# Patient Record
Sex: Female | Born: 1965 | Hispanic: Yes | Marital: Married | Smoking: Never smoker
Health system: Southern US, Community
[De-identification: ages and names within clinical notes are randomized; demographics above are authoritative.]

## PROBLEM LIST (undated history)

## (undated) DIAGNOSIS — E669 Obesity, unspecified: Secondary | ICD-10-CM

## (undated) DIAGNOSIS — I10 Essential (primary) hypertension: Secondary | ICD-10-CM

## (undated) HISTORY — PX: TUBAL LIGATION: SHX77

## (undated) HISTORY — PX: CHOLECYSTECTOMY: SHX55

## (undated) HISTORY — PX: OTHER SURGICAL HISTORY: SHX169

---

## 2007-02-07 ENCOUNTER — Ambulatory Visit: Payer: Self-pay | Admitting: Family Medicine

## 2010-08-24 ENCOUNTER — Ambulatory Visit: Payer: Self-pay | Admitting: Family Medicine

## 2010-08-30 ENCOUNTER — Ambulatory Visit: Payer: Self-pay | Admitting: Family Medicine

## 2011-05-09 ENCOUNTER — Emergency Department: Payer: Self-pay | Admitting: *Deleted

## 2011-09-28 IMAGING — US ULTRASOUND RIGHT BREAST
1 series · 7 of 7 positions shown · non-contrast
Comparison: none

REASON FOR EXAM: RT MASS
COMMENTS:

PROCEDURE:     US  - US BREAST RIGHT  - August 30, 2010  [DATE]
RESULT:

[Series 1: ultrasound right breast · 7 of 7 slices shown]
[im 1/7]
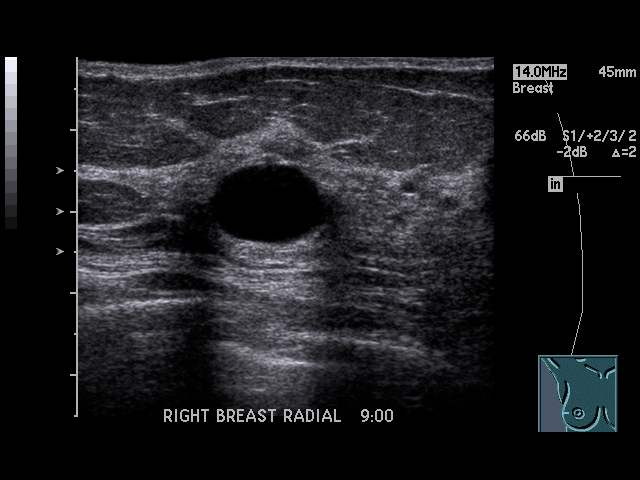
[im 2/7]
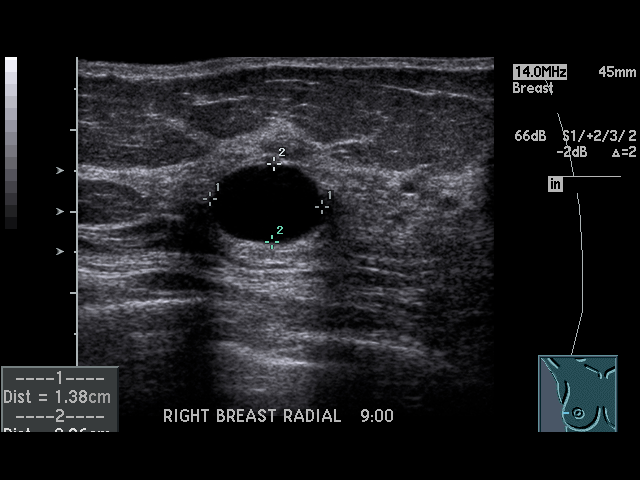
[im 3/7]
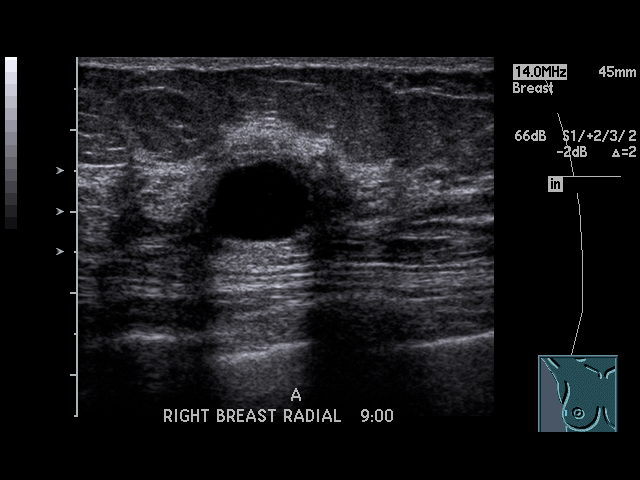
[im 4/7]
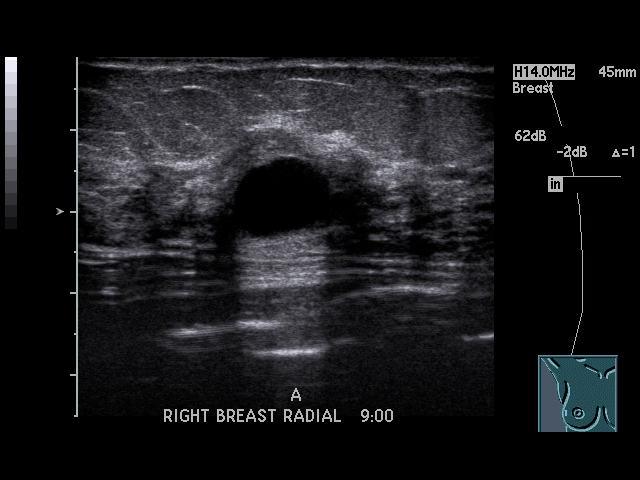
[im 5/7]
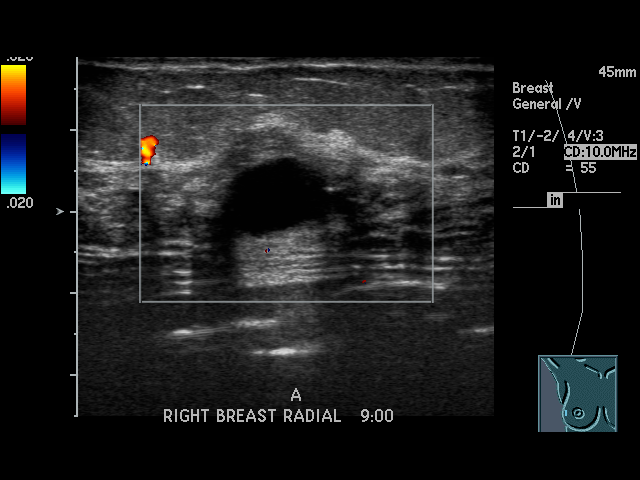
[im 6/7]
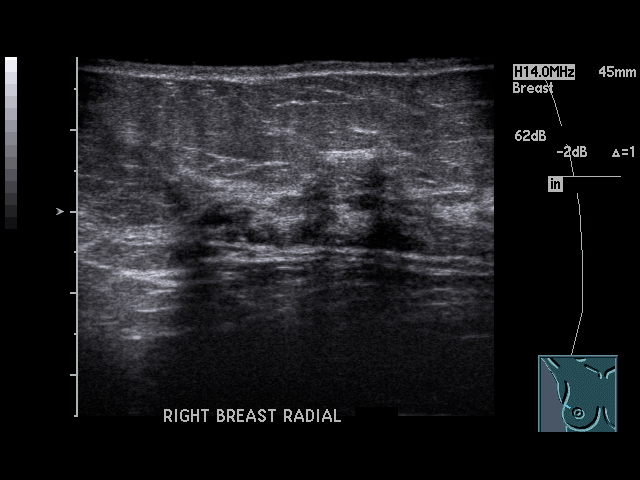
[im 7/7]
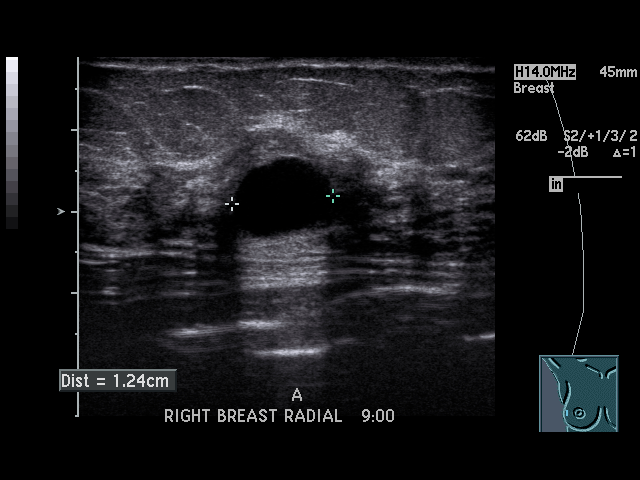

[7 of 7 positions shown; findings below may reference images not displayed]

FINDINGS: Real-time sonography of the right breast was performed in the 9
o'clock position. There is a well-circumscribed anechoic right breast mass
measuring 1.4 x 1 x 1.2 cm at the 9 o'clock position with increased through
transmission and an imperceptible wall. There is no mural nodule. There is
no internal septation. The appearance is most consistent with a cyst.
IMPRESSION: 1.      Previously demonstrated mammographic abnormality at the 9 o'clock
position in the right breast demonstrates sonographic characteristics most
consistent with a small cyst. No further evaluation recommended.
2.     Return to annual mammographic follow-up is recommended.

BI-RADS: Category 2 - Benign Findings

Thank you for this opportunity to contribute to the care of your patient.

A NEGATIVE MAMMOGRAM REPORT DOES NOT PRECLUDE BIOPSY OR OTHER EVALUATION OF
A CLINICALLY PALPABLE OR OTHERWISE SUSPICIOUS MASS OR LESION. BREAST CANCER
MAY NOT BE DETECTED BY MAMMOGRAPHY IN UP TO 10% OF CASES.

## 2012-08-28 ENCOUNTER — Ambulatory Visit: Payer: Self-pay | Admitting: Family Medicine

## 2013-09-24 ENCOUNTER — Ambulatory Visit: Payer: Self-pay | Admitting: Family Medicine

## 2014-11-03 ENCOUNTER — Ambulatory Visit: Payer: Self-pay | Admitting: Family Medicine

## 2015-12-08 ENCOUNTER — Other Ambulatory Visit: Payer: Self-pay | Admitting: Family Medicine

## 2015-12-08 DIAGNOSIS — Z1231 Encounter for screening mammogram for malignant neoplasm of breast: Secondary | ICD-10-CM

## 2015-12-17 ENCOUNTER — Ambulatory Visit
Admission: RE | Admit: 2015-12-17 | Discharge: 2015-12-17 | Disposition: A | Discharge status: Home / Self Care | Payer: Managed Care, Other (non HMO) | Source: Ambulatory Visit | Attending: Family Medicine | Admitting: Family Medicine

## 2015-12-17 DIAGNOSIS — Z1231 Encounter for screening mammogram for malignant neoplasm of breast: Secondary | ICD-10-CM | POA: Diagnosis not present

## 2016-10-21 ENCOUNTER — Ambulatory Visit
Admission: RE | Admit: 2016-10-21 | Payer: Managed Care, Other (non HMO) | Source: Ambulatory Visit | Admitting: Unknown Physician Specialty

## 2016-10-21 ENCOUNTER — Encounter: Admission: RE | Payer: Self-pay | Source: Ambulatory Visit

## 2016-10-21 SURGERY — COLONOSCOPY WITH PROPOFOL
Anesthesia: General

## 2016-12-30 ENCOUNTER — Encounter: Payer: Self-pay | Admitting: *Deleted

## 2017-01-02 ENCOUNTER — Encounter: Payer: Self-pay | Admitting: *Deleted

## 2017-01-02 ENCOUNTER — Ambulatory Visit
Admission: RE | Admit: 2017-01-02 | Discharge: 2017-01-02 | Disposition: A | Discharge status: Home / Self Care | Payer: Managed Care, Other (non HMO) | Source: Ambulatory Visit | Attending: Unknown Physician Specialty | Admitting: Unknown Physician Specialty

## 2017-01-02 ENCOUNTER — Ambulatory Visit: Payer: Managed Care, Other (non HMO) | Admitting: Certified Registered Nurse Anesthetist

## 2017-01-02 ENCOUNTER — Encounter
Admission: RE | Disposition: A | Discharge status: Home / Self Care | Payer: Self-pay | Source: Ambulatory Visit | Attending: Unknown Physician Specialty

## 2017-01-02 DIAGNOSIS — D123 Benign neoplasm of transverse colon: Secondary | ICD-10-CM | POA: Diagnosis not present

## 2017-01-02 DIAGNOSIS — E669 Obesity, unspecified: Secondary | ICD-10-CM | POA: Insufficient documentation

## 2017-01-02 DIAGNOSIS — Z1211 Encounter for screening for malignant neoplasm of colon: Secondary | ICD-10-CM | POA: Insufficient documentation

## 2017-01-02 DIAGNOSIS — D120 Benign neoplasm of cecum: Secondary | ICD-10-CM | POA: Diagnosis not present

## 2017-01-02 DIAGNOSIS — K640 First degree hemorrhoids: Secondary | ICD-10-CM | POA: Diagnosis not present

## 2017-01-02 DIAGNOSIS — Z6830 Body mass index (BMI) 30.0-30.9, adult: Secondary | ICD-10-CM | POA: Diagnosis not present

## 2017-01-02 HISTORY — PX: COLONOSCOPY WITH PROPOFOL: SHX5780

## 2017-01-02 LAB — POCT PREGNANCY, URINE: PREG TEST UR: NEGATIVE

## 2017-01-02 SURGERY — COLONOSCOPY WITH PROPOFOL
Anesthesia: General

## 2017-01-02 MED ORDER — PROPOFOL 10 MG/ML IV BOLUS
INTRAVENOUS | Status: AC
  Filled 2017-01-02: qty 20

## 2017-01-02 MED ORDER — SODIUM CHLORIDE 0.9 % IV SOLN
INTRAVENOUS | Status: DC
  Administered 2017-01-02: 1000 mL via INTRAVENOUS

## 2017-01-02 MED ORDER — PROPOFOL 500 MG/50ML IV EMUL
INTRAVENOUS | Status: DC | PRN
  Administered 2017-01-02: 150 ug/kg/min via INTRAVENOUS

## 2017-01-02 MED ORDER — PROPOFOL 500 MG/50ML IV EMUL
INTRAVENOUS | Status: AC
  Filled 2017-01-02: qty 50

## 2017-01-02 MED ORDER — PROPOFOL 10 MG/ML IV BOLUS
INTRAVENOUS | Status: DC | PRN
  Administered 2017-01-02 (×2): 30 mg via INTRAVENOUS
  Administered 2017-01-02: 50 mg via INTRAVENOUS

## 2017-01-02 MED ORDER — SODIUM CHLORIDE 0.9 % IV SOLN: INTRAVENOUS | Status: DC

## 2017-01-02 NOTE — Transfer of Care (Signed)
Immediate Anesthesia Transfer of Care Note  Patient: Alexis Massey  Procedure(s) Performed: Procedure(s): COLONOSCOPY WITH PROPOFOL (N/A)  Patient Location: PACU  Anesthesia Type:General  Level of Consciousness: awake, alert  and oriented  Airway & Oxygen Therapy: Patient Spontanous Breathing and Patient connected to nasal cannula oxygen  Post-op Assessment: Report given to RN and Post -op Vital signs reviewed and stable  Post vital signs: Reviewed and stable  Last Vitals:  Vitals:   01/02/17 0834 01/02/17 0943  BP: (!) 142/73 111/78  Pulse: 83 83  Resp: 16 17  Temp: 36.1 C (!) 35.9 C    Last Pain:  Vitals:   01/02/17 0943  TempSrc: Tympanic         Complications: No apparent anesthesia complications

## 2017-01-02 NOTE — Anesthesia Preprocedure Evaluation (Addendum)
Anesthesia Evaluation  Patient identified by MRN, date of birth, ID band Patient awake    Reviewed: Allergy & Precautions, NPO status , Patient's Chart, lab work & pertinent test results, reviewed documented beta blocker date and time   Airway Mallampati: III  TM Distance: >3 FB     Dental  (+) Chipped   Pulmonary           Cardiovascular      Neuro/Psych    GI/Hepatic   Endo/Other    Renal/GU      Musculoskeletal   Abdominal   Peds  Hematology   Anesthesia Other Findings Obese.  Reproductive/Obstetrics                            Anesthesia Physical Anesthesia Plan  ASA: II  Anesthesia Plan: General   Post-op Pain Management:    Induction: Intravenous  Airway Management Planned:   Additional Equipment:   Intra-op Plan:   Post-operative Plan:   Informed Consent: I have reviewed the patients History and Physical, chart, labs and discussed the procedure including the risks, benefits and alternatives for the proposed anesthesia with the patient or authorized representative who has indicated his/her understanding and acceptance.     Plan Discussed with: CRNA  Anesthesia Plan Comments:         Anesthesia Quick Evaluation

## 2017-01-02 NOTE — Op Note (Signed)
[facility] Patient Name: Alexis Massey Procedure Date: 01/02/2017 9:09 AM MRN: [id number] Account #: [id number] Date of Birth: [birth year] Admit Type: Outpatient Age: 51 Room: [facility] ENDO ROOM 3 Gender: Female Note Status: Finalized Procedure:            Colonoscopy Indications:          Screening for colorectal malignant neoplasm Providers:            Manya Silvas, MD Referring MD:         Dion Body (Referring MD) Medicines:            Propofol per Anesthesia Complications:        No immediate complications. Procedure:            Pre-Anesthesia Assessment:                       - After reviewing the risks and benefits, the patient                        was deemed in satisfactory condition to undergo the                        procedure.                       After obtaining informed consent, the colonoscope was                        passed under direct vision. Throughout the procedure,                        the patient's blood pressure, pulse, and oxygen                        saturations were monitored continuously. The                        Colonoscope was introduced through the anus and                        advanced to the the cecum, identified by appendiceal                        orifice and ileocecal valve. The colonoscopy was                        performed without difficulty. The patient tolerated the                        procedure well. The quality of the bowel preparation                        was excellent. Findings:      A diminutive polyp was found in the cecum. The polyp was sessile. The       polyp was removed with a jumbo cold forceps. Resection and retrieval       were complete.      Two sessile polyps were found in the transverse colon. The polyps were       diminutive in size. These polyps were removed with a jumbo cold forceps.  Resection and retrieval were complete.      Internal hemorrhoids were  found during endoscopy. The hemorrhoids were       small and Grade I (internal hemorrhoids that do not prolapse).      The exam was otherwise without abnormality. Impression:           - One diminutive polyp in the cecum, removed with a                        jumbo cold forceps. Resected and retrieved.                       - Two diminutive polyps in the transverse colon,                        removed with a jumbo cold forceps. Resected and                        retrieved.                       - Internal hemorrhoids.                       - The examination was otherwise normal. Recommendation:       - Await pathology results. Manya Silvas, MD 01/02/2017 9:41:02 AM This report has been signed electronically. Number of Addenda: 0 Note Initiated On: 01/02/2017 9:09 AM Scope Withdrawal Time: 0 hours 16 minutes 46 seconds  Total Procedure Duration: 0 hours 24 minutes 31 seconds       [facility]

## 2017-01-02 NOTE — Anesthesia Postprocedure Evaluation (Signed)
Anesthesia Post Note  Patient: Alexis Massey  Procedure(s) Performed: Procedure(s) (LRB): COLONOSCOPY WITH PROPOFOL (N/A)  Patient location during evaluation: Endoscopy Anesthesia Type: General Level of consciousness: awake and alert Pain management: pain level controlled Vital Signs Assessment: post-procedure vital signs reviewed and stable Respiratory status: spontaneous breathing, nonlabored ventilation, respiratory function stable and patient connected to nasal cannula oxygen Cardiovascular status: blood pressure returned to baseline and stable Postop Assessment: no signs of nausea or vomiting Anesthetic complications: no     Last Vitals:  Vitals:   01/02/17 0953 01/02/17 1003  BP: 119/77 123/83  Pulse: 73 72  Resp: 14 15  Temp:      Last Pain:  Vitals:   01/02/17 0943  TempSrc: Tympanic                 Shamia Uppal S

## 2017-01-02 NOTE — H&P (Signed)
   Primary Care Physician:  Dion Body, MD Primary Gastroenterologist:  Dr. Vira Agar  Pre-Procedure History & Physical: HPI:  Alexis Massey is a 51 y.o. female is here for an colonoscopy.   History reviewed. No pertinent past medical history.  Past Surgical History:  Procedure Laterality Date  . c section x2    . CHOLECYSTECTOMY    . csections    . TUBAL LIGATION      Prior to Admission medications   Not on File    Allergies as of 11/24/2016  . (Not on File)    Family History  Problem Relation Age of Onset  . Breast cancer Neg Hx     Social History   Social History  . Marital status: Married    Spouse name: N/A  . Number of children: N/A  . Years of education: N/A   Occupational History  . Not on file.   Social History Main Topics  . Smoking status: Never Smoker  . Smokeless tobacco: Never Used  . Alcohol use No  . Drug use: No  . Sexual activity: Not on file   Other Topics Concern  . Not on file   Social History Narrative  . No narrative on file    Review of Systems: See HPI, otherwise negative ROS  Physical Exam: BP (!) 142/73   Pulse 83   Temp 97 F (36.1 C) (Tympanic)   Resp 16   Ht 5\' 3"  (1.6 m)   Wt 77.1 kg (170 lb)   SpO2 100%   BMI 30.11 kg/m  General:   Alert,  pleasant and cooperative in NAD Head:  Normocephalic and atraumatic. Neck:  Supple; no masses or thyromegaly. Lungs:  Clear throughout to auscultation.    Heart:  Regular rate and rhythm. Abdomen:  Soft, nontender and nondistended. Normal bowel sounds, without guarding, and without rebound.   Neurologic:  Alert and  oriented x4;  grossly normal neurologically.  Impression/Plan: Alexis Massey is here for an colonoscopy to be performed for screening  Risks, benefits, limitations, and alternatives regarding  colonoscopy have been reviewed with the patient.  Questions have been answered.  All parties agreeable.   Gaylyn Cheers, MD  01/02/2017, 9:06 AM

## 2017-01-02 NOTE — Anesthesia Post-op Follow-up Note (Cosign Needed)
Anesthesia QCDR form completed.        

## 2017-01-03 ENCOUNTER — Encounter: Payer: Self-pay | Admitting: Unknown Physician Specialty

## 2017-01-03 LAB — SURGICAL PATHOLOGY

## 2017-02-15 ENCOUNTER — Other Ambulatory Visit: Payer: Self-pay | Admitting: Family Medicine

## 2017-02-15 DIAGNOSIS — Z1231 Encounter for screening mammogram for malignant neoplasm of breast: Secondary | ICD-10-CM

## 2017-03-07 ENCOUNTER — Ambulatory Visit
Admission: RE | Admit: 2017-03-07 | Discharge: 2017-03-07 | Disposition: A | Discharge status: Home / Self Care | Payer: Managed Care, Other (non HMO) | Source: Ambulatory Visit | Attending: Family Medicine | Admitting: Family Medicine

## 2017-03-07 DIAGNOSIS — Z1231 Encounter for screening mammogram for malignant neoplasm of breast: Secondary | ICD-10-CM | POA: Diagnosis not present

## 2018-04-20 DIAGNOSIS — Z85828 Personal history of other malignant neoplasm of skin: Secondary | ICD-10-CM | POA: Diagnosis not present

## 2018-04-20 DIAGNOSIS — I8393 Asymptomatic varicose veins of bilateral lower extremities: Secondary | ICD-10-CM | POA: Diagnosis not present

## 2018-04-20 DIAGNOSIS — D2261 Melanocytic nevi of right upper limb, including shoulder: Secondary | ICD-10-CM | POA: Diagnosis not present

## 2018-04-26 DIAGNOSIS — N760 Acute vaginitis: Secondary | ICD-10-CM | POA: Diagnosis not present

## 2018-06-01 ENCOUNTER — Other Ambulatory Visit: Payer: Self-pay | Admitting: Family Medicine

## 2018-06-01 DIAGNOSIS — Z1231 Encounter for screening mammogram for malignant neoplasm of breast: Secondary | ICD-10-CM

## 2018-06-06 DIAGNOSIS — Z0000 Encounter for general adult medical examination without abnormal findings: Secondary | ICD-10-CM | POA: Diagnosis not present

## 2018-06-13 ENCOUNTER — Ambulatory Visit
Admission: RE | Admit: 2018-06-13 | Discharge: 2018-06-13 | Disposition: A | Discharge status: Home / Self Care | Payer: 59 | Source: Ambulatory Visit | Attending: Family Medicine | Admitting: Family Medicine

## 2018-06-13 DIAGNOSIS — E6609 Other obesity due to excess calories: Secondary | ICD-10-CM | POA: Diagnosis not present

## 2018-06-13 DIAGNOSIS — Z0000 Encounter for general adult medical examination without abnormal findings: Secondary | ICD-10-CM | POA: Diagnosis not present

## 2018-06-13 DIAGNOSIS — R030 Elevated blood-pressure reading, without diagnosis of hypertension: Secondary | ICD-10-CM | POA: Diagnosis not present

## 2018-06-13 DIAGNOSIS — Z1231 Encounter for screening mammogram for malignant neoplasm of breast: Secondary | ICD-10-CM | POA: Insufficient documentation

## 2018-06-18 ENCOUNTER — Other Ambulatory Visit: Payer: Self-pay | Admitting: Family Medicine

## 2018-06-18 DIAGNOSIS — R928 Other abnormal and inconclusive findings on diagnostic imaging of breast: Secondary | ICD-10-CM

## 2018-06-18 DIAGNOSIS — N6012 Diffuse cystic mastopathy of left breast: Secondary | ICD-10-CM | POA: Diagnosis not present

## 2018-06-18 DIAGNOSIS — N6320 Unspecified lump in the left breast, unspecified quadrant: Secondary | ICD-10-CM

## 2018-06-27 ENCOUNTER — Ambulatory Visit
Admission: RE | Admit: 2018-06-27 | Discharge: 2018-06-27 | Disposition: A | Discharge status: Home / Self Care | Payer: 59 | Source: Ambulatory Visit | Attending: Family Medicine | Admitting: Family Medicine

## 2018-06-27 DIAGNOSIS — N6320 Unspecified lump in the left breast, unspecified quadrant: Secondary | ICD-10-CM

## 2018-06-27 DIAGNOSIS — R928 Other abnormal and inconclusive findings on diagnostic imaging of breast: Secondary | ICD-10-CM | POA: Diagnosis not present

## 2018-06-27 DIAGNOSIS — R922 Inconclusive mammogram: Secondary | ICD-10-CM | POA: Diagnosis not present

## 2018-07-05 DIAGNOSIS — Z01419 Encounter for gynecological examination (general) (routine) without abnormal findings: Secondary | ICD-10-CM | POA: Diagnosis not present

## 2018-07-05 DIAGNOSIS — N898 Other specified noninflammatory disorders of vagina: Secondary | ICD-10-CM | POA: Diagnosis not present

## 2018-08-08 DIAGNOSIS — Z23 Encounter for immunization: Secondary | ICD-10-CM | POA: Diagnosis not present

## 2018-10-08 DIAGNOSIS — R740 Nonspecific elevation of levels of transaminase and lactic acid dehydrogenase [LDH]: Secondary | ICD-10-CM | POA: Diagnosis not present

## 2018-10-08 DIAGNOSIS — R7303 Prediabetes: Secondary | ICD-10-CM | POA: Diagnosis not present

## 2018-10-15 DIAGNOSIS — R030 Elevated blood-pressure reading, without diagnosis of hypertension: Secondary | ICD-10-CM | POA: Diagnosis not present

## 2018-10-15 DIAGNOSIS — E6609 Other obesity due to excess calories: Secondary | ICD-10-CM | POA: Diagnosis not present

## 2018-10-15 DIAGNOSIS — Z6835 Body mass index (BMI) 35.0-35.9, adult: Secondary | ICD-10-CM | POA: Diagnosis not present

## 2019-07-26 IMAGING — MG MM DIGITAL DIAGNOSTIC UNILAT*L* W/ TOMO W/ CAD
4 series · 4 of 12 positions shown · non-contrast
Comparison: None [DATE]

CLINICAL DATA: The patient returns after screening study for
evaluation of a possible LEFT breast mass.

EXAM:
DIGITAL DIAGNOSTIC LEFT MAMMOGRAM WITH TOMO
ULTRASOUND LEFT BREAST

[L MLO synth-2D]
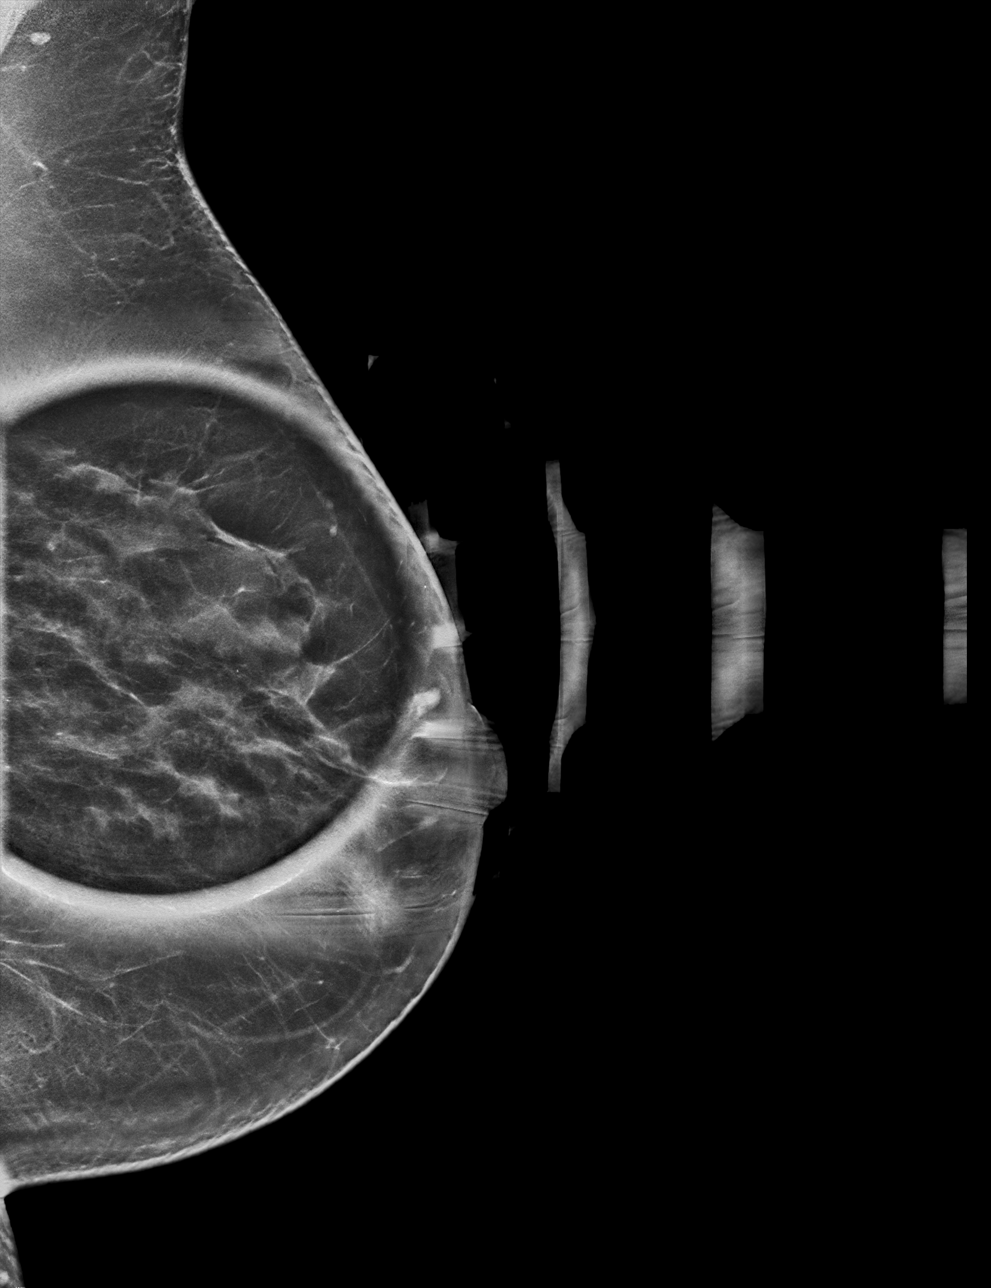

[L CC synth-2D]
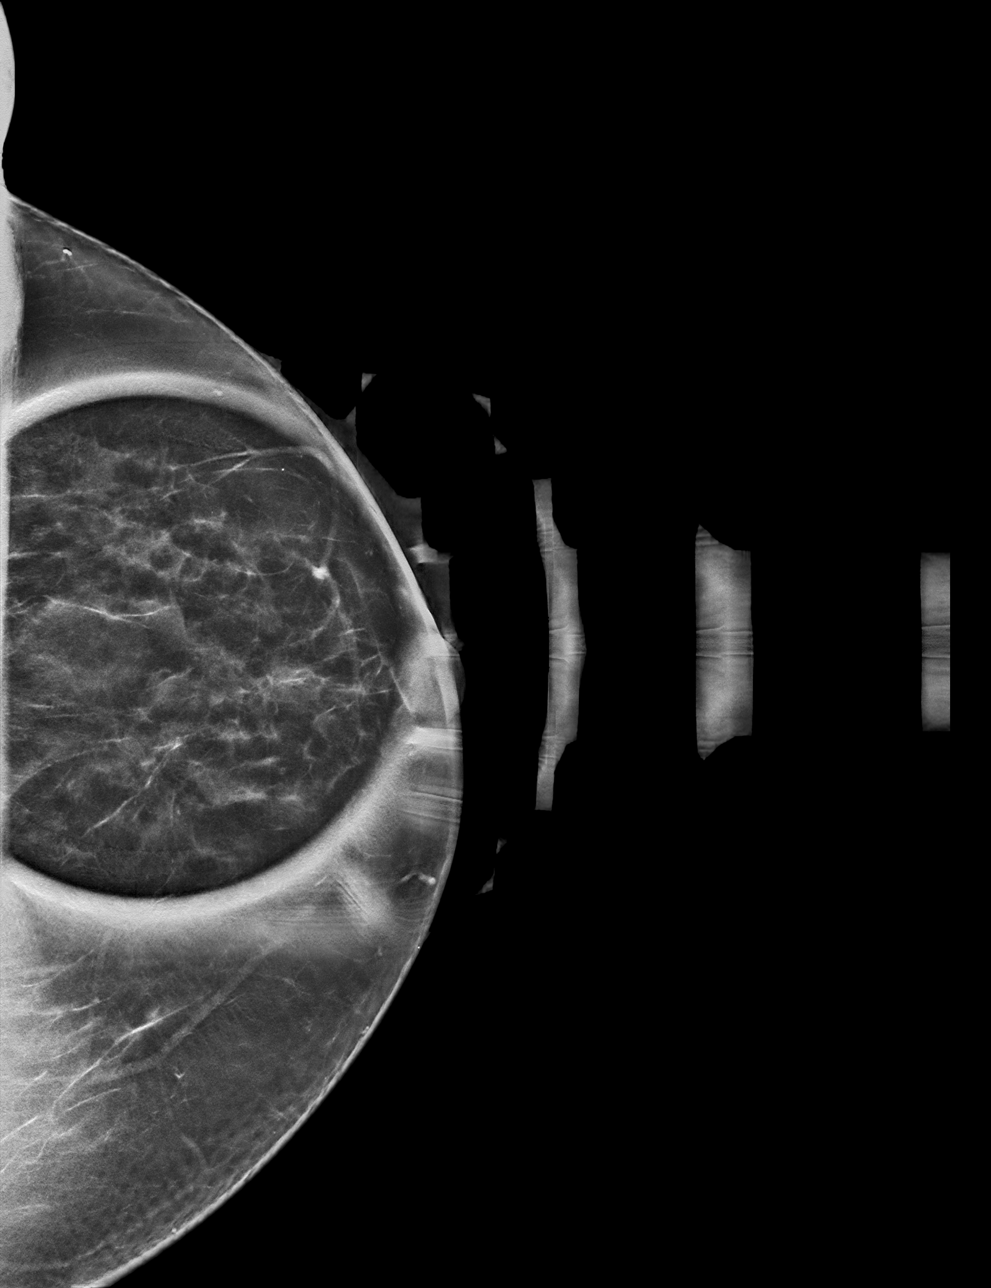

[L CC tomo · tomo slice 28/55.0]
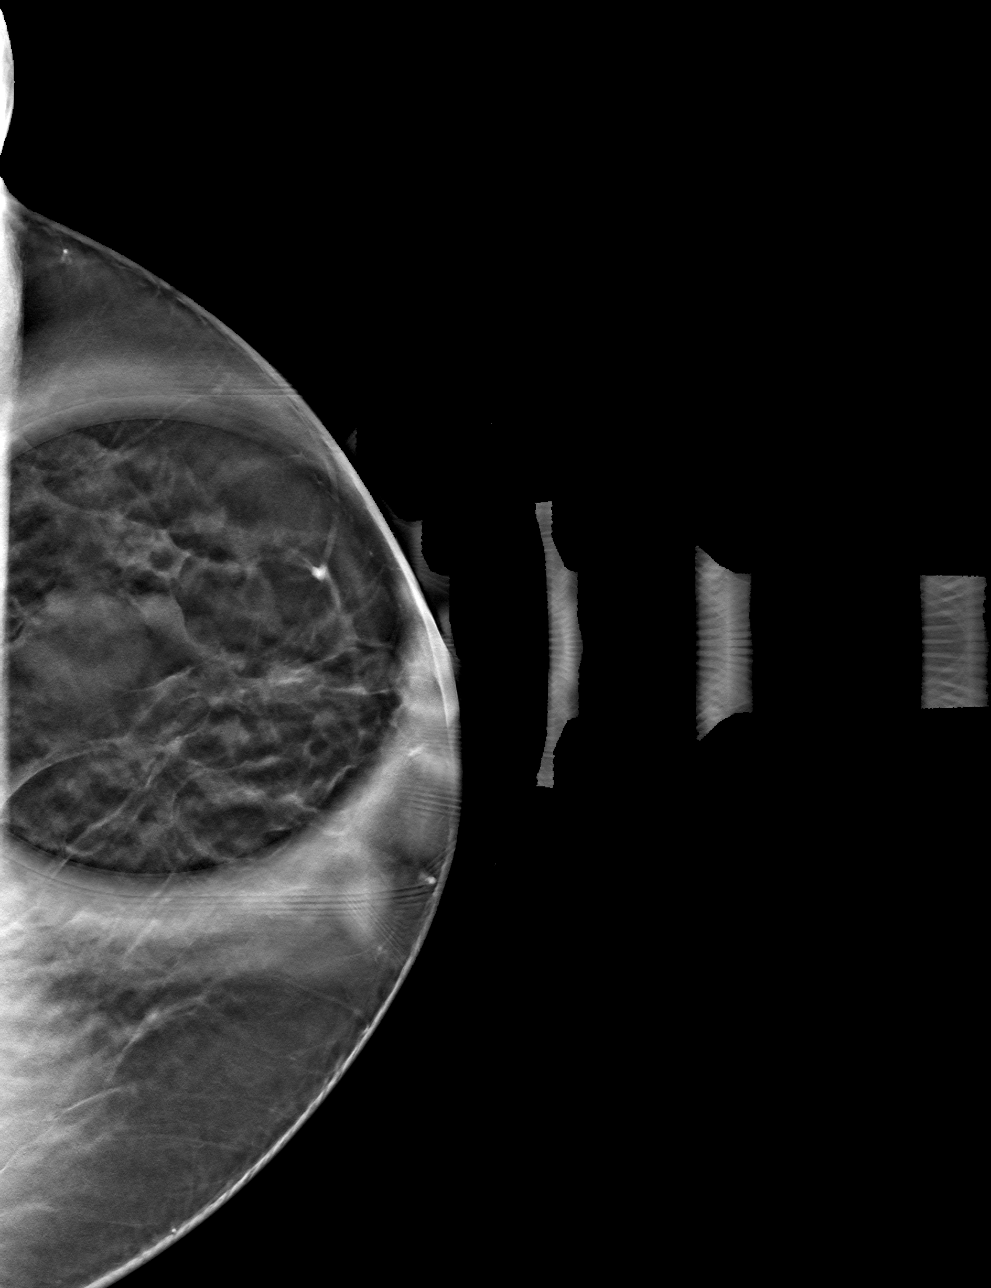

[L MLO tomo · tomo slice 33/66.0]
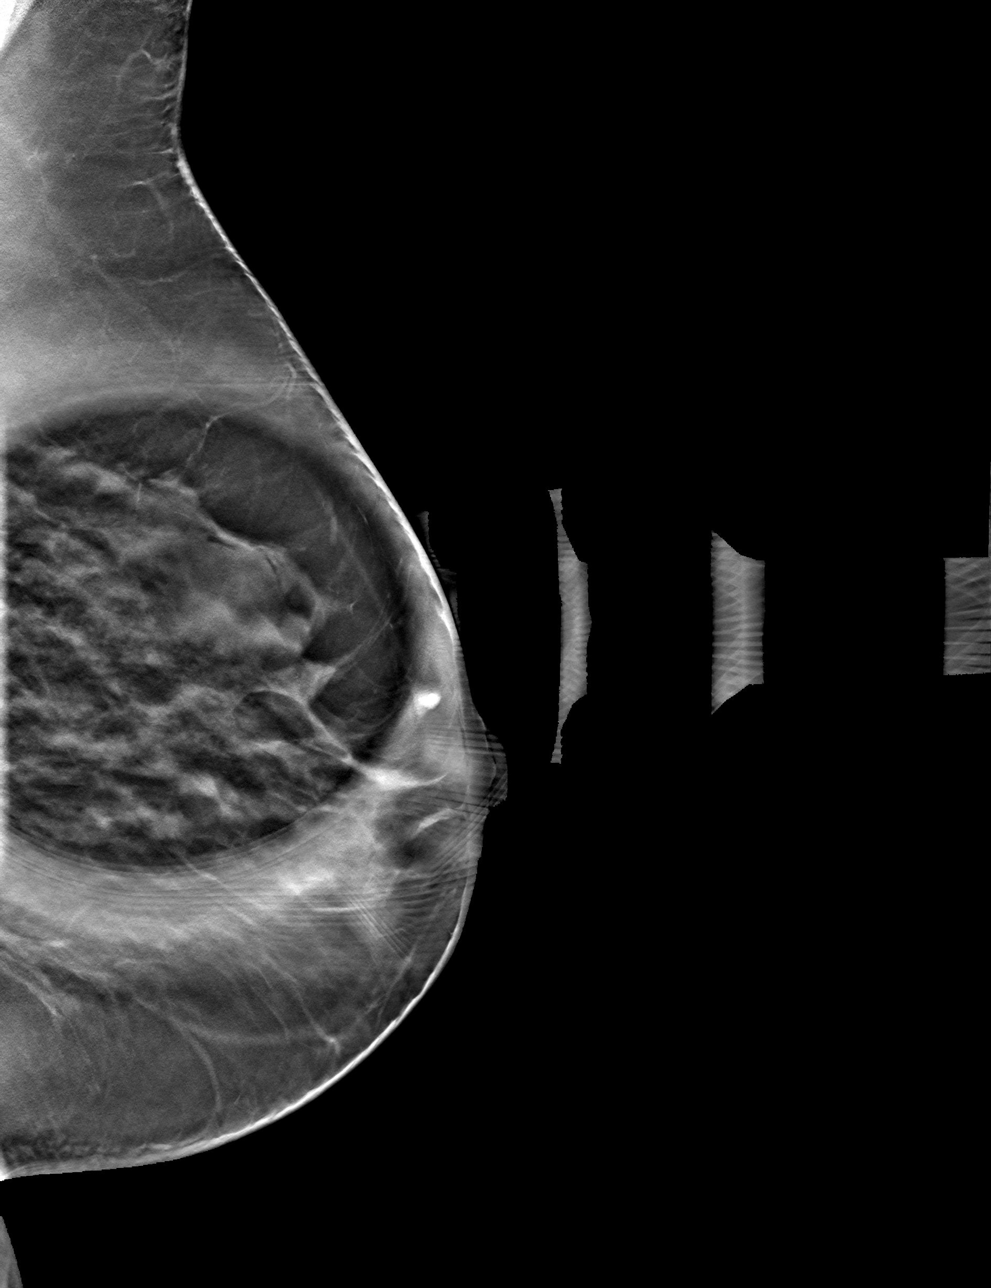

[4 of 12 positions shown; findings below may reference images not displayed]

ACR Breast Density Category c: The breast tissue is heterogeneously
dense, which may obscure small masses.
FINDINGS: Additional 2-D and 3-D images are performed. Views confirm presence
of a circumscribed oval mass in the UPPER-OUTER QUADRANT of the LEFT
breast.

On physical exam, I palpate a rounded mobile mass in the UPPER-OUTER
QUADRANT of the LEFT breast.

Targeted ultrasound is performed, showing a simple cyst in the 1
o'clock location of the LEFT breast 3 centimeters from the nipple
which measures 2.5 x 2.1 x 1.1 centimeters. No solid mass or areas
of acoustic shadowing identified.
IMPRESSION: Simple cyst in the LEFT breast accounting for the mammographic
abnormality. No mammographic or ultrasound evidence for malignancy.

RECOMMENDATION:
Screening mammogram in one year.(Code:[id number]-J-A6O)

I have discussed the findings and recommendations with the patient.
Results were also provided in writing at the conclusion of the
visit. If applicable, a reminder letter will be sent to the patient
regarding the next appointment.

BI-RADS CATEGORY  2: Benign.

## 2019-08-09 ENCOUNTER — Other Ambulatory Visit: Payer: Self-pay | Admitting: Family Medicine

## 2019-08-09 DIAGNOSIS — Z1231 Encounter for screening mammogram for malignant neoplasm of breast: Secondary | ICD-10-CM

## 2019-10-22 ENCOUNTER — Ambulatory Visit
Admission: RE | Admit: 2019-10-22 | Discharge: 2019-10-22 | Disposition: A | Discharge status: Home / Self Care | Payer: 59 | Source: Ambulatory Visit | Attending: Family Medicine | Admitting: Family Medicine

## 2019-10-22 DIAGNOSIS — Z1231 Encounter for screening mammogram for malignant neoplasm of breast: Secondary | ICD-10-CM | POA: Diagnosis not present

## 2020-05-27 ENCOUNTER — Other Ambulatory Visit (INDEPENDENT_AMBULATORY_CARE_PROVIDER_SITE_OTHER): Payer: Self-pay | Admitting: Nurse Practitioner

## 2020-05-27 DIAGNOSIS — I83899 Varicose veins of unspecified lower extremities with other complications: Secondary | ICD-10-CM

## 2020-05-29 ENCOUNTER — Encounter (INDEPENDENT_AMBULATORY_CARE_PROVIDER_SITE_OTHER): Payer: Self-pay

## 2020-05-29 ENCOUNTER — Encounter (INDEPENDENT_AMBULATORY_CARE_PROVIDER_SITE_OTHER): Payer: Self-pay | Admitting: Nurse Practitioner

## 2020-07-13 ENCOUNTER — Other Ambulatory Visit
Admission: RE | Admit: 2020-07-13 | Discharge: 2020-07-13 | Disposition: A | Discharge status: Home / Self Care | Payer: 59 | Source: Ambulatory Visit | Attending: Internal Medicine | Admitting: Internal Medicine

## 2020-07-13 ENCOUNTER — Other Ambulatory Visit: Payer: Self-pay

## 2020-07-13 DIAGNOSIS — Z20822 Contact with and (suspected) exposure to covid-19: Secondary | ICD-10-CM | POA: Insufficient documentation

## 2020-07-13 DIAGNOSIS — Z01812 Encounter for preprocedural laboratory examination: Secondary | ICD-10-CM | POA: Diagnosis not present

## 2020-07-13 LAB — SARS CORONAVIRUS 2 (TAT 6-24 HRS): SARS Coronavirus 2: NEGATIVE

## 2020-07-14 ENCOUNTER — Encounter: Payer: Self-pay | Admitting: Internal Medicine

## 2020-08-17 ENCOUNTER — Encounter: Admission: RE | Payer: Self-pay | Source: Home / Self Care

## 2020-08-17 ENCOUNTER — Ambulatory Visit: Admission: RE | Admit: 2020-08-17 | Payer: 59 | Source: Home / Self Care | Admitting: Internal Medicine

## 2020-08-17 HISTORY — DX: Essential (primary) hypertension: I10

## 2020-08-17 HISTORY — DX: Obesity, unspecified: E66.9

## 2020-08-17 SURGERY — COLONOSCOPY
Anesthesia: General

## 2020-09-10 ENCOUNTER — Other Ambulatory Visit: Admission: RE | Admit: 2020-09-10 | Payer: 59 | Source: Ambulatory Visit

## 2020-09-11 ENCOUNTER — Other Ambulatory Visit
Admission: RE | Admit: 2020-09-11 | Discharge: 2020-09-11 | Disposition: A | Discharge status: Home / Self Care | Payer: 59 | Source: Ambulatory Visit | Attending: Internal Medicine | Admitting: Internal Medicine

## 2020-09-11 ENCOUNTER — Encounter: Payer: Self-pay | Admitting: Internal Medicine

## 2020-09-11 ENCOUNTER — Other Ambulatory Visit: Payer: Self-pay

## 2020-09-11 DIAGNOSIS — Z20822 Contact with and (suspected) exposure to covid-19: Secondary | ICD-10-CM | POA: Diagnosis not present

## 2020-09-11 DIAGNOSIS — Z01812 Encounter for preprocedural laboratory examination: Secondary | ICD-10-CM | POA: Insufficient documentation

## 2020-09-12 LAB — SARS CORONAVIRUS 2 (TAT 6-24 HRS): SARS Coronavirus 2: NEGATIVE

## 2020-09-14 ENCOUNTER — Ambulatory Visit: Payer: 59 | Admitting: Certified Registered Nurse Anesthetist

## 2020-09-14 ENCOUNTER — Encounter
Admission: RE | Disposition: A | Discharge status: Home / Self Care | Payer: Self-pay | Source: Home / Self Care | Attending: Internal Medicine

## 2020-09-14 ENCOUNTER — Ambulatory Visit
Admission: RE | Admit: 2020-09-14 | Discharge: 2020-09-14 | Disposition: A | Discharge status: Home / Self Care | Payer: 59 | Attending: Internal Medicine | Admitting: Internal Medicine

## 2020-09-14 ENCOUNTER — Encounter: Payer: Self-pay | Admitting: Internal Medicine

## 2020-09-14 DIAGNOSIS — Z86010 Personal history of colonic polyps: Secondary | ICD-10-CM | POA: Diagnosis not present

## 2020-09-14 DIAGNOSIS — K640 First degree hemorrhoids: Secondary | ICD-10-CM | POA: Diagnosis not present

## 2020-09-14 DIAGNOSIS — Z1211 Encounter for screening for malignant neoplasm of colon: Secondary | ICD-10-CM | POA: Insufficient documentation

## 2020-09-14 HISTORY — PX: COLONOSCOPY WITH PROPOFOL: SHX5780

## 2020-09-14 SURGERY — COLONOSCOPY WITH PROPOFOL
Anesthesia: General

## 2020-09-14 MED ORDER — SODIUM CHLORIDE 0.9 % IV SOLN
INTRAVENOUS | Status: DC
  Administered 2020-09-14: 1000 mL via INTRAVENOUS

## 2020-09-14 MED ORDER — LIDOCAINE HCL (CARDIAC) PF 100 MG/5ML IV SOSY
PREFILLED_SYRINGE | INTRAVENOUS | Status: DC | PRN
  Administered 2020-09-14: 50 mg via INTRAVENOUS

## 2020-09-14 MED ORDER — PROPOFOL 10 MG/ML IV BOLUS
INTRAVENOUS | Status: DC | PRN
  Administered 2020-09-14: 20 mg via INTRAVENOUS
  Administered 2020-09-14: 70 mg via INTRAVENOUS
  Administered 2020-09-14: 10 mg via INTRAVENOUS

## 2020-09-14 MED ORDER — PROPOFOL 500 MG/50ML IV EMUL
INTRAVENOUS | Status: AC
  Filled 2020-09-14: qty 100

## 2020-09-14 MED ORDER — PROPOFOL 500 MG/50ML IV EMUL
INTRAVENOUS | Status: DC | PRN
  Administered 2020-09-14: 160 ug/kg/min via INTRAVENOUS

## 2020-09-14 NOTE — Anesthesia Postprocedure Evaluation (Signed)
Anesthesia Post Note  Patient: Alexis Massey  Procedure(s) Performed: COLONOSCOPY WITH PROPOFOL (N/A )  Patient location during evaluation: Endoscopy Anesthesia Type: General Level of consciousness: awake and alert Pain management: pain level controlled Vital Signs Assessment: post-procedure vital signs reviewed and stable Respiratory status: spontaneous breathing, nonlabored ventilation, respiratory function stable and patient connected to nasal cannula oxygen Cardiovascular status: blood pressure returned to baseline and stable Postop Assessment: no apparent nausea or vomiting Anesthetic complications: no   No complications documented.   Last Vitals:  Vitals:   09/14/20 1005 09/14/20 1052  BP: 140/88 127/75  Pulse: (!) 105 93  Resp: 18 15  Temp: 36.9 C 36.6 C  SpO2: 98% 98%    Last Pain:  Vitals:   09/14/20 1122  TempSrc:   PainSc: 0-No pain                 Arita Miss

## 2020-09-14 NOTE — H&P (Signed)
Outpatient short stay form Pre-procedure 09/14/2020 10:02 AM Alexis Massey K. Alice Reichert, M.D.  Primary Physician: Dion Body, M.D.  Reason for visit:  Personal history of adenomatous colon polyps x 3. 2018.  History of present illness:                            Patient presents for colonoscopy for a personal hx of colon polyps. The patient denies abdominal pain, abnormal weight loss or rectal bleeding.     No current facility-administered medications for this encounter.  Medications Prior to Admission  Medication Sig Dispense Refill Last Dose  . estradiol (ESTRACE) 0.1 MG/GM vaginal cream Place 1 Applicatorful vaginally 3 (three) times a week.   09/13/2020 at Unknown time     No Known Allergies   Past Medical History:  Diagnosis Date  . Hypertension   . Obesity     Review of systems:  Otherwise negative.    Physical Exam  Gen: Alert, oriented. Appears stated age.  HEENT: [place]/AT. PERRLA. Lungs: CTA, no wheezes. CV: RR nl S1, S2. Abd: soft, benign, no masses. BS+ Ext: No edema. Pulses 2+    Planned procedures: Proceed with colonoscopy. The patient understands the nature of the planned procedure, indications, risks, alternatives and potential complications including but not limited to bleeding, infection, perforation, damage to internal organs and possible oversedation/side effects from anesthesia. The patient agrees and gives consent to proceed.  Please refer to procedure notes for findings, recommendations and patient disposition/instructions.     Alexis Massey K. Alice Reichert, M.D. Gastroenterology 09/14/2020  10:02 AM

## 2020-09-14 NOTE — Interval H&P Note (Signed)
History and Physical Interval Note:  40/99/2780 04:47 AM  Alexis Massey  has presented today for surgery, with the diagnosis of HX COLON POLYP.  The various methods of treatment have been discussed with the patient and family. After consideration of risks, benefits and other options for treatment, the patient has consented to  Procedure(s) with comments: COLONOSCOPY WITH PROPOFOL (N/A) - C-19 TEST ON 12/10 as a surgical intervention.  The patient's history has been reviewed, patient examined, no change in status, stable for surgery.  I have reviewed the patient's chart and labs.  Questions were answered to the patient's satisfaction.     [place], [place]

## 2020-09-14 NOTE — Anesthesia Procedure Notes (Signed)
Performed by: Demetrius Charity, CRNA Pre-anesthesia Checklist: Patient identified, Emergency Drugs available, Suction available, Timeout performed and Patient being monitored Oxygen Delivery Method: Nasal cannula Placement Confirmation: CO2 detector

## 2020-09-14 NOTE — Transfer of Care (Signed)
Immediate Anesthesia Transfer of Care Note  Patient: Alexis Massey  Procedure(s) Performed: COLONOSCOPY WITH PROPOFOL (N/A )  Patient Location: PACU  Anesthesia Type:General  Level of Consciousness: awake, alert  and oriented  Airway & Oxygen Therapy: Patient Spontanous Breathing  Post-op Assessment: Report given to RN and Post -op Vital signs reviewed and stable  Post vital signs: Reviewed and stable  Last Vitals:  Vitals Value Taken Time  BP 127/75 09/14/20 1052  Temp    Pulse 84 09/14/20 1053  Resp 15 09/14/20 1053  SpO2 98 % 09/14/20 1053  Vitals shown include unvalidated device data.  Last Pain:  Vitals:   09/14/20 1005  TempSrc: Temporal  PainSc: 0-No pain         Complications: No complications documented.

## 2020-09-14 NOTE — Anesthesia Preprocedure Evaluation (Signed)
Anesthesia Evaluation  Patient identified by MRN, date of birth, ID band Patient awake    Reviewed: Allergy & Precautions, NPO status , Patient's Chart, lab work & pertinent test results, reviewed documented beta blocker date and time   History of Anesthesia Complications Negative for: history of anesthetic complications  Airway Mallampati: II  TM Distance: >3 FB Neck ROM: Full    Dental  (+) Partial Upper   Pulmonary neg pulmonary ROS, neg sleep apnea, neg COPD, Patient abstained from smoking.Not current smoker,    Pulmonary exam normal breath sounds clear to auscultation       Cardiovascular Exercise Tolerance: Good METShypertension, (-) CAD and (-) Past MI (-) dysrhythmias  Rhythm:Regular Rate:Normal - Systolic murmurs    Neuro/Psych negative neurological ROS  negative psych ROS   GI/Hepatic neg GERD  ,(+)     (-) substance abuse  ,   Endo/Other  neg diabetes  Renal/GU negative Renal ROS     Musculoskeletal   Abdominal   Peds  Hematology   Anesthesia Other Findings Past Medical History: No date: Hypertension No date: Obesity   Reproductive/Obstetrics                             Anesthesia Physical  Anesthesia Plan  ASA: II  Anesthesia Plan: General   Post-op Pain Management:    Induction: Intravenous  PONV Risk Score and Plan: 3 and Ondansetron, Propofol infusion and TIVA  Airway Management Planned: Nasal Cannula  Additional Equipment: None  Intra-op Plan:   Post-operative Plan:   Informed Consent: I have reviewed the patients History and Physical, chart, labs and discussed the procedure including the risks, benefits and alternatives for the proposed anesthesia with the patient or authorized representative who has indicated his/her understanding and acceptance.     Dental advisory given  Plan Discussed with: CRNA  Anesthesia Plan Comments: (Discussed risks of  anesthesia with patient, including possibility of difficulty with spontaneous ventilation under anesthesia necessitating airway intervention, PONV, and rare risks such as cardiac or respiratory or neurological events. Patient understands.)        Anesthesia Quick Evaluation

## 2020-09-14 NOTE — Op Note (Signed)
[facility] Patient Name: Alexis Massey Procedure Date: 09/14/2020 10:07 AM MRN: [id number] Account #: [id number] Date of Birth: 08-14-1966 Admit Type: Outpatient Age: 54 Room: [facility] ENDO ROOM 2 Gender: Female Note Status: Finalized Procedure:             Colonoscopy Indications:           High risk colon cancer surveillance: Personal history                         of multiple (3 or more) adenomas Providers:             Lorie Apley K. Alice Reichert MD, MD Referring MD:          Dion Body (Referring MD) Medicines:             Propofol per Anesthesia Complications:         No immediate complications. Procedure:             Pre-Anesthesia Assessment:                        - The risks and benefits of the procedure and the                         sedation options and risks were discussed with the                         patient. All questions were answered and informed                         consent was obtained.                        - Patient identification and proposed procedure were                         verified prior to the procedure by the nurse. The                         procedure was verified in the procedure room.                        - ASA Grade Assessment: II - A patient with mild                         systemic disease.                        - After reviewing the risks and benefits, the patient                         was deemed in satisfactory condition to undergo the                         procedure.                        After obtaining informed consent, the colonoscope was                         passed under direct vision. Throughout  the procedure,                         the patient's blood pressure, pulse, and oxygen                         saturations were monitored continuously. The                         Colonoscope was introduced through the anus and                         advanced to the the cecum, identified by  appendiceal                         orifice and ileocecal valve. The colonoscopy was                         performed without difficulty. The patient tolerated                         the procedure well. The quality of the bowel                         preparation was good. The ileocecal valve, appendiceal                         orifice, and rectum were photographed. Findings:      The perianal and digital rectal examinations were normal. Pertinent       negatives include normal sphincter tone and no palpable rectal lesions.      Non-bleeding internal hemorrhoids were found during retroflexion. The       hemorrhoids were Grade I (internal hemorrhoids that do not prolapse).      The colon (entire examined portion) appeared normal. Impression:            - Non-bleeding internal hemorrhoids.                        - The entire examined colon is normal.                        - No specimens collected. Recommendation:        - Patient has a contact number available for                         emergencies. The signs and symptoms of potential                         delayed complications were discussed with the patient.                         Return to normal activities tomorrow. Written                         discharge instructions were provided to the patient.                        - Resume previous diet.                        -  Continue present medications.                        - Repeat colonoscopy in 5 years for surveillance.                        - Return to GI office PRN.                        - The findings and recommendations were discussed with                         the patient. Procedure Code(s):     --- Professional ---                        O1771, Colorectal cancer screening; colonoscopy on                         individual at high risk Diagnosis Code(s):     --- Professional ---                        K64.0, First degree hemorrhoids                        Z86.010,  Personal history of colonic polyps CPT copyright 2019 [employer]. All rights reserved. The codes documented in this report are preliminary and upon coder review may  be revised to meet current compliance requirements. Efrain Sella MD, MD 09/14/2020 10:52:27 AM This report has been signed electronically. Number of Addenda: 0 Note Initiated On: 09/14/2020 10:07 AM Scope Withdrawal Time: 0 hours 6 minutes 9 seconds  Total Procedure Duration: 0 hours 13 minutes 55 seconds  Estimated Blood Loss:  Estimated blood loss: none.      [facility]

## 2020-09-15 ENCOUNTER — Encounter: Payer: Self-pay | Admitting: Internal Medicine

## 2020-11-19 ENCOUNTER — Other Ambulatory Visit: Payer: Self-pay | Admitting: Family Medicine

## 2020-11-19 DIAGNOSIS — Z1231 Encounter for screening mammogram for malignant neoplasm of breast: Secondary | ICD-10-CM

## 2020-12-03 ENCOUNTER — Other Ambulatory Visit: Payer: Self-pay

## 2020-12-03 ENCOUNTER — Ambulatory Visit
Admission: RE | Admit: 2020-12-03 | Discharge: 2020-12-03 | Disposition: A | Discharge status: Home / Self Care | Payer: BC Managed Care – PPO | Source: Ambulatory Visit | Attending: Family Medicine | Admitting: Family Medicine

## 2020-12-03 DIAGNOSIS — Z1231 Encounter for screening mammogram for malignant neoplasm of breast: Secondary | ICD-10-CM | POA: Insufficient documentation

## 2021-12-21 ENCOUNTER — Other Ambulatory Visit: Payer: Self-pay | Admitting: Family Medicine

## 2021-12-21 DIAGNOSIS — Z1231 Encounter for screening mammogram for malignant neoplasm of breast: Secondary | ICD-10-CM

## 2021-12-23 ENCOUNTER — Other Ambulatory Visit: Payer: Self-pay

## 2021-12-23 ENCOUNTER — Ambulatory Visit
Admission: RE | Admit: 2021-12-23 | Discharge: 2021-12-23 | Disposition: A | Discharge status: Home / Self Care | Payer: BC Managed Care – PPO | Source: Ambulatory Visit | Attending: Family Medicine | Admitting: Family Medicine

## 2021-12-23 DIAGNOSIS — Z1231 Encounter for screening mammogram for malignant neoplasm of breast: Secondary | ICD-10-CM | POA: Diagnosis present

## 2021-12-27 ENCOUNTER — Other Ambulatory Visit: Payer: Self-pay | Admitting: Family Medicine

## 2021-12-27 DIAGNOSIS — N6489 Other specified disorders of breast: Secondary | ICD-10-CM

## 2021-12-27 DIAGNOSIS — R928 Other abnormal and inconclusive findings on diagnostic imaging of breast: Secondary | ICD-10-CM

## 2021-12-27 DIAGNOSIS — R921 Mammographic calcification found on diagnostic imaging of breast: Secondary | ICD-10-CM

## 2022-01-12 ENCOUNTER — Ambulatory Visit
Admission: RE | Admit: 2022-01-12 | Discharge: 2022-01-12 | Disposition: A | Discharge status: Home / Self Care | Payer: BC Managed Care – PPO | Source: Ambulatory Visit | Attending: Family Medicine | Admitting: Family Medicine

## 2022-01-12 DIAGNOSIS — R928 Other abnormal and inconclusive findings on diagnostic imaging of breast: Secondary | ICD-10-CM | POA: Insufficient documentation

## 2022-01-12 DIAGNOSIS — R921 Mammographic calcification found on diagnostic imaging of breast: Secondary | ICD-10-CM

## 2022-01-12 DIAGNOSIS — N6489 Other specified disorders of breast: Secondary | ICD-10-CM | POA: Insufficient documentation

## 2022-08-01 ENCOUNTER — Encounter (INDEPENDENT_AMBULATORY_CARE_PROVIDER_SITE_OTHER): Payer: Self-pay

## 2022-09-29 ENCOUNTER — Encounter: Payer: Self-pay | Admitting: Family Medicine

## 2022-09-30 ENCOUNTER — Other Ambulatory Visit: Payer: Self-pay | Admitting: Family Medicine

## 2022-09-30 DIAGNOSIS — R921 Mammographic calcification found on diagnostic imaging of breast: Secondary | ICD-10-CM

## 2022-11-15 ENCOUNTER — Other Ambulatory Visit: Payer: Self-pay

## 2022-11-15 DIAGNOSIS — R7401 Elevation of levels of liver transaminase levels: Secondary | ICD-10-CM

## 2022-11-25 ENCOUNTER — Ambulatory Visit: Payer: BC Managed Care – PPO | Attending: Family Medicine

## 2022-12-07 ENCOUNTER — Ambulatory Visit
Admission: RE | Admit: 2022-12-07 | Discharge: 2022-12-07 | Disposition: A | Discharge status: Home / Self Care | Payer: BC Managed Care – PPO | Source: Ambulatory Visit | Attending: Family Medicine | Admitting: Family Medicine

## 2022-12-07 DIAGNOSIS — R7401 Elevation of levels of liver transaminase levels: Secondary | ICD-10-CM | POA: Diagnosis present

## 2022-12-26 ENCOUNTER — Ambulatory Visit
Admission: RE | Admit: 2022-12-26 | Discharge: 2022-12-26 | Disposition: A | Discharge status: Home / Self Care | Payer: BC Managed Care – PPO | Source: Ambulatory Visit | Attending: Family Medicine | Admitting: Family Medicine

## 2022-12-26 DIAGNOSIS — R921 Mammographic calcification found on diagnostic imaging of breast: Secondary | ICD-10-CM | POA: Diagnosis not present

## 2023-10-18 ENCOUNTER — Other Ambulatory Visit: Payer: Self-pay | Admitting: Family Medicine

## 2023-10-18 DIAGNOSIS — Z1231 Encounter for screening mammogram for malignant neoplasm of breast: Secondary | ICD-10-CM

## 2023-10-18 DIAGNOSIS — R921 Mammographic calcification found on diagnostic imaging of breast: Secondary | ICD-10-CM

## 2024-04-10 ENCOUNTER — Ambulatory Visit
Admission: RE | Admit: 2024-04-10 | Discharge: 2024-04-10 | Disposition: A | Discharge status: Home / Self Care | Source: Ambulatory Visit | Attending: Family Medicine | Admitting: Family Medicine

## 2024-04-10 DIAGNOSIS — Z1231 Encounter for screening mammogram for malignant neoplasm of breast: Secondary | ICD-10-CM | POA: Insufficient documentation

## 2024-04-10 DIAGNOSIS — R921 Mammographic calcification found on diagnostic imaging of breast: Secondary | ICD-10-CM | POA: Diagnosis present

## 2024-04-11 ENCOUNTER — Other Ambulatory Visit: Payer: Self-pay | Admitting: Family Medicine

## 2024-04-11 DIAGNOSIS — R928 Other abnormal and inconclusive findings on diagnostic imaging of breast: Secondary | ICD-10-CM

## 2024-04-12 ENCOUNTER — Ambulatory Visit
Admission: RE | Admit: 2024-04-12 | Discharge: 2024-04-12 | Disposition: A | Discharge status: Home / Self Care | Source: Ambulatory Visit | Attending: Family Medicine | Admitting: Family Medicine

## 2024-04-12 DIAGNOSIS — R928 Other abnormal and inconclusive findings on diagnostic imaging of breast: Secondary | ICD-10-CM

## 2024-04-12 DIAGNOSIS — N6011 Diffuse cystic mastopathy of right breast: Secondary | ICD-10-CM | POA: Insufficient documentation

## 2024-04-12 HISTORY — PX: BREAST BIOPSY: SHX20

## 2024-04-12 MED ORDER — LIDOCAINE 1 % OPTIME INJ - NO CHARGE
5.0000 mL | Freq: Once | INTRAMUSCULAR | Status: AC
  Administered 2024-04-12: 5 mL
  Filled 2024-04-12: qty 6

## 2024-04-12 MED ORDER — LIDOCAINE-EPINEPHRINE 1 %-1:100000 IJ SOLN
20.0000 mL | Freq: Once | INTRAMUSCULAR | Status: AC
  Administered 2024-04-12: 20 mL
  Filled 2024-04-12: qty 20

## 2024-04-15 LAB — SURGICAL PATHOLOGY
